# Patient Record
Sex: Male | Born: 1995 | Race: White | Hispanic: No | Marital: Single | Smoking: Never smoker
Health system: Southern US, Community
[De-identification: ages and names within clinical notes are randomized; demographics above are authoritative.]

---
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
## 2017-01-09 ENCOUNTER — Ambulatory Visit
Admission: EM | Admit: 2017-01-09 | Discharge: 2017-01-09 | Disposition: A | Discharge status: Home / Self Care | Payer: Self-pay | Attending: Family | Admitting: Family

## 2017-01-09 ENCOUNTER — Encounter: Payer: Self-pay | Admitting: *Deleted

## 2017-01-09 DIAGNOSIS — R51 Headache: Secondary | ICD-10-CM

## 2017-01-09 DIAGNOSIS — J029 Acute pharyngitis, unspecified: Secondary | ICD-10-CM

## 2017-01-09 DIAGNOSIS — R509 Fever, unspecified: Secondary | ICD-10-CM

## 2017-01-09 DIAGNOSIS — R69 Illness, unspecified: Secondary | ICD-10-CM

## 2017-01-09 DIAGNOSIS — J111 Influenza due to unidentified influenza virus with other respiratory manifestations: Secondary | ICD-10-CM

## 2017-01-09 LAB — RAPID STREP SCREEN (MED CTR MEBANE ONLY): STREPTOCOCCUS, GROUP A SCREEN (DIRECT): NEGATIVE

## 2017-01-09 LAB — RAPID INFLUENZA A&B ANTIGENS
Influenza A (ARMC): NEGATIVE
Influenza B (ARMC): NEGATIVE

## 2017-01-09 NOTE — ED Provider Notes (Signed)
CSN: [id number]     Arrival date & time 01/09/17  1204 History   None    Chief Complaint  Patient presents with  . Sore Throat  . Fever  . Headache  . Generalized Body Aches   (Consider location/radiation/quality/duration/timing/severity/associated sxs/prior Treatment) Chief complaint of sore throat initially 2  days, unchanged.  Endorses green mucous cough. fever tmax 101 yesterday, body aches and chills,swollen glands.  Fever, chills, HA, 'joint achiness' has resolved since this morning. Describes headache as 'mild'.  NO wheezing, sob, vision changes,rash  Nonsmoker  When 5-6 yo had asthma.   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        History reviewed. No pertinent past medical history. History reviewed. No pertinent surgical history. History reviewed. No pertinent family history. Social History  Substance Use Topics  . Smoking status: Never Smoker  . Smokeless tobacco: Never Used  . Alcohol use No    Review of Systems  Constitutional: Positive for chills and fever.  HENT: Positive for congestion, sinus pressure, sore throat and trouble swallowing. Negative for ear pain and rhinorrhea.   Respiratory: Positive for cough. Negative for shortness of breath and wheezing.   Cardiovascular: Negative for chest pain and palpitations.  Gastrointestinal: Negative for abdominal pain, diarrhea, nausea and vomiting.  Genitourinary: Negative for dysuria.  Musculoskeletal: Negative for myalgias.  Skin: Negative for rash.  Neurological: Positive for headaches.  Hematological: Negative for adenopathy.    Allergies  Patient has no known allergies.  Home Medications   Prior to Admission medications   Not on File   Meds Ordered and Administered this Visit  Medications - No data to display  BP 110/62 (BP Location: Left Arm)   Pulse 63   Temp 98.4 F (36.9 C) (Oral)   Resp 16   Ht 5\' 7"  (1.702 m)   Wt 135 lb (61.2 kg)   SpO2 100%    BMI 21.14 kg/m  No data found.   Physical Exam  Constitutional: Vital signs are normal. He appears well-developed and well-nourished.  HENT:  Head: Normocephalic and atraumatic.  Right Ear: Hearing, tympanic membrane, external ear and ear canal normal. No drainage, swelling or tenderness. Tympanic membrane is not injected, not erythematous and not bulging. No middle ear effusion. No decreased hearing is noted.  Left Ear: Hearing, tympanic membrane, external ear and ear canal normal. No drainage, swelling or tenderness. Tympanic membrane is not injected, not erythematous and not bulging.  No middle ear effusion. No decreased hearing is noted.  Nose: Nose normal. Right sinus exhibits no maxillary sinus tenderness and no frontal sinus tenderness. Left sinus exhibits no maxillary sinus tenderness and no frontal sinus tenderness.  Mouth/Throat: Uvula is midline, oropharynx is clear and moist and mucous membranes are normal. No oropharyngeal exudate, posterior oropharyngeal edema, posterior oropharyngeal erythema or tonsillar abscesses.  Eyes: Pupils are equal, round, and reactive to light. Conjunctivae, EOM and lids are normal. Lids are everted and swept, no foreign bodies found.  Neck: Normal range of motion and full passive range of motion without pain. Neck supple. No neck rigidity. No Brudzinski's sign and no Kernig's sign noted.  Cardiovascular: Regular rhythm and normal heart sounds.   Pulmonary/Chest: Effort normal and breath sounds normal. No respiratory distress. He has no wheezes. He has no rhonchi. He has no rales.  Lymphadenopathy:       Head (right side): No submental, no submandibular, no tonsillar, no preauricular, no posterior auricular and no occipital  adenopathy present.       Head (left side): No submental, no submandibular, no tonsillar, no preauricular, no posterior auricular and no occipital adenopathy present.    He has no cervical adenopathy.  Neurological: He is alert. He has  normal strength. No cranial nerve deficit or sensory deficit. He displays a negative Romberg sign.  Reflex Scores:      Bicep reflexes are 2+ on the right side and 2+ on the left side.      Patellar reflexes are 2+ on the right side and 2+ on the left side. Grip equal and strong bilateral upper extremities. Gait strong and steady. Able to perform rapid alternating movement without difficulty.  Skin: Skin is warm and dry.  Psychiatric: He has a normal mood and affect. His speech is normal and behavior is normal.  Vitals reviewed.   Urgent Care Course     Procedures (including critical care time)  Labs Review Labs Reviewed  RAPID STRE[facility] SCREEN (NOT [facility])  RAPID INFLUENZA A&B ANTIGENS ([facility] ONLY)  CULTURE, GROUP A STREP [facility])    Imaging Review No results found.          MDM   1. Influenza-like illness   2. Pharyngitis, unspecified etiology     Negative strep and influenza. Pending throat culture.Patient is well-appearing and is afebrile. He is not labored and speech. He does not have any severe ha, vision changes, nuchal rigidity. We jointly agreed likely viral in nature. Strict return precautions given.    Allegra Granarnett, Janyth Riera G, FNP 01/09/17 1359

## 2017-01-09 NOTE — Discharge Instructions (Signed)
As discussed, symptoms support viral pharyngitis. Pending throat culture and will call you it were to result positive strep.  As discussed, plenty fluids and rest.  Please stay very vigilant any new or worsening symptoms, please return to urgent care or go to the nearest emergency room.

## 2017-01-09 NOTE — ED Triage Notes (Signed)
Sore throat, headache, body aches, head congestion, x 3-5 days.

## 2017-01-11 LAB — CULTURE, GROUP A STREP (THRC)

## 2017-02-02 ENCOUNTER — Encounter: Payer: Self-pay | Admitting: Emergency Medicine

## 2017-02-02 ENCOUNTER — Emergency Department: Payer: 59

## 2017-02-02 ENCOUNTER — Emergency Department
Admission: EM | Admit: 2017-02-02 | Discharge: 2017-02-02 | Disposition: A | Discharge status: Home / Self Care | Payer: 59 | Attending: Emergency Medicine | Admitting: Emergency Medicine

## 2017-02-02 DIAGNOSIS — K226 Gastro-esophageal laceration-hemorrhage syndrome: Secondary | ICD-10-CM | POA: Insufficient documentation

## 2017-02-02 DIAGNOSIS — K5289 Other specified noninfective gastroenteritis and colitis: Secondary | ICD-10-CM | POA: Insufficient documentation

## 2017-02-02 DIAGNOSIS — R112 Nausea with vomiting, unspecified: Secondary | ICD-10-CM | POA: Insufficient documentation

## 2017-02-02 DIAGNOSIS — R1010 Upper abdominal pain, unspecified: Secondary | ICD-10-CM | POA: Diagnosis present

## 2017-02-02 DIAGNOSIS — K529 Noninfective gastroenteritis and colitis, unspecified: Secondary | ICD-10-CM

## 2017-02-02 LAB — CBC
HEMATOCRIT: 44.5 % (ref 40.0–52.0)
HEMOGLOBIN: 15.4 g/dL (ref 13.0–18.0)
MCH: 28.9 pg (ref 26.0–34.0)
MCHC: 34.7 g/dL (ref 32.0–36.0)
MCV: 83.4 fL (ref 80.0–100.0)
Platelets: 333 10*3/uL (ref 150–440)
RBC: 5.33 MIL/uL (ref 4.40–5.90)
RDW: 13.5 % (ref 11.5–14.5)
WBC: 7.2 10*3/uL (ref 3.8–10.6)

## 2017-02-02 LAB — COMPREHENSIVE METABOLIC PANEL
ALBUMIN: 5 g/dL (ref 3.5–5.0)
ALK PHOS: 61 U/L (ref 38–126)
ALT: 15 U/L — ABNORMAL LOW (ref 17–63)
ANION GAP: 11 (ref 5–15)
AST: 24 U/L (ref 15–41)
BILIRUBIN TOTAL: 0.4 mg/dL (ref 0.3–1.2)
BUN: 12 mg/dL (ref 6–20)
CALCIUM: 9.7 mg/dL (ref 8.9–10.3)
CO2: 28 mmol/L (ref 22–32)
Chloride: 103 mmol/L (ref 101–111)
Creatinine, Ser: 0.92 mg/dL (ref 0.61–1.24)
GFR calc Af Amer: >60 mL/min (ref >60)
GFR calc non Af Amer: >60 mL/min (ref >60)
GLUCOSE: 89 mg/dL (ref 65–99)
POTASSIUM: 3.8 mmol/L (ref 3.5–5.1)
SODIUM: 142 mmol/L (ref 135–145)
Total Protein: 8.3 g/dL — ABNORMAL HIGH (ref 6.5–8.1)

## 2017-02-02 LAB — LIPASE, BLOOD: Lipase: 27 U/L (ref 11–51)

## 2017-02-02 MED ORDER — GI COCKTAIL ~~LOC~~
30.0000 mL | Freq: Once | ORAL | Status: AC
  Administered 2017-02-02: 30 mL via ORAL
  Filled 2017-02-02: qty 30

## 2017-02-02 NOTE — Discharge Instructions (Signed)
Please make sure you remain well-hydrated today and use Pepto-Bismol or Maalox as needed for abdominal discomfort. Follow-up with your primary care physician as needed and return to the emergency department for any concerns such as if you cannot eat or drink, worsening pain, fevers, or for any other concerns whatsoever.  It was a pleasure to take care of you today, and thank you for coming to our emergency department.  If you have any questions or concerns before leaving please ask the nurse to grab me and I'm more than happy to go through your aftercare instructions again.  If you were prescribed any opioid pain medication today such as Norco, Vicodin, Percocet, morphine, hydrocodone, or oxycodone please make sure you do not drive when you are taking this medication as it can alter your ability to drive safely.  If you have any concerns once you are home that you are not improving or are in fact getting worse before you can make it to your follow-up appointment, please do not hesitate to call 911 and come back for further evaluation.  Merrily Brittle, MD  Results for orders placed or performed during the hospital encounter of 02/02/17  Lipase, blood  Result Value Ref Range   Lipase 27 11 - 51 U/L  Comprehensive metabolic panel  Result Value Ref Range   Sodium 142 135 - 145 mmol/L   Potassium 3.8 3.5 - 5.1 mmol/L   Chloride 103 101 - 111 mmol/L   CO2 28 22 - 32 mmol/L   Glucose, Bld 89 65 - 99 mg/dL   BUN 12 6 - 20 mg/dL   Creatinine, Ser 4.69 0.61 - 1.24 mg/dL   Calcium 9.7 8.9 - 62.9 mg/dL   Total Protein 8.3 (H) 6.5 - 8.1 g/dL   Albumin 5.0 3.5 - 5.0 g/dL   AST 24 15 - 41 U/L   ALT 15 (L) 17 - 63 U/L   Alkaline Phosphatase 61 38 - 126 U/L   Total Bilirubin 0.4 0.3 - 1.2 mg/dL   GFR calc non Af Amer >60 >60 mL/min   GFR calc Af Amer >60 >60 mL/min   Anion gap 11 5 - 15  CBC  Result Value Ref Range   WBC 7.2 3.8 - 10.6 K/uL   RBC 5.33 4.40 - 5.90 MIL/uL   Hemoglobin 15.4 13.0 -  18.0 g/dL   HCT 52.8 41.3 - 24.4 %   MCV 83.4 80.0 - 100.0 fL   MCH 28.9 26.0 - 34.0 pg   MCHC 34.7 32.0 - 36.0 g/dL   RDW 01.0 27.2 - 53.6 %   Platelets 333 150 - 440 K/uL   Dg Chest 1 View  Result Date: 02/02/2017 CLINICAL DATA:  Epigastric pain. EXAM: CHEST 1 VIEW COMPARISON:  None. FINDINGS: The heart size and mediastinal contours are within normal limits. Both lungs are clear. The visualized skeletal structures are unremarkable. IMPRESSION: Normal chest x-ray. Electronically Signed   By: Obie Dredge M.D.   On: 02/02/2017 10:16

## 2017-02-02 NOTE — ED Provider Notes (Signed)
[facility] Emergency Department Provider Note  ____________________________________________   First MD Initiated Contact with Patient 02/02/17 1017     (approximate)  I have reviewed the triage vital signs and the nursing notes.   HISTORY  Chief Complaint Abdominal Pain    HPI Ronald Nielsen is a 21 y.o. male who self presents to the emergency department with moderate severity cramping upper abdominal discomfort nausea and vomiting. The pain all began roughly 3 hours after eating a chicken sandwich at [employer] for breakfast. The patient is an employee at [employer] and his symptoms began insidiously. He slowly noted cramping upper abdominal discomfort and nausea. He initially went to the bathroom because he thought he might have to defecate however he then vomited 4 times. The first 3 episodes of emesis were normal in the fourth had a small amount of dark red blood mixed in. His pain is now mild in severity and improved largely after emesis. He has no past medical history. He has no past abdominal surgical history. He does not drink alcohol.   History reviewed. No pertinent past medical history.  There are no active problems to display for this patient.   History reviewed. No pertinent surgical history.  Prior to Admission medications   Not on File    Allergies Patient has no known allergies.  No family history on file.  Social History Social History  Substance Use Topics  . Smoking status: Never Smoker  . Smokeless tobacco: Never Used  . Alcohol use No    Review of Systems Constitutional: No fever/chills Eyes: No visual changes. ENT: No sore throat. Cardiovascular: Denies chest pain. Respiratory: Denies shortness of breath. Gastrointestinal: Positive abdominal pain.  Positive nausea, Positive vomiting.  No diarrhea.  No constipation. Genitourinary: Negative for dysuria. Musculoskeletal: Negative for back pain. Skin: Negative for  rash. Neurological: Negative for headaches, focal weakness or numbness.   ____________________________________________   PHYSICAL EXAM:  VITAL SIGNS: ED Triage Vitals  Enc Vitals Group     BP 02/02/17 0928 (!) 129/92     Pulse Rate 02/02/17 0928 (!) 102     Resp 02/02/17 0928 18     Temp 02/02/17 0928 98.3 F (36.8 C)     Temp Source 02/02/17 0928 Oral     SpO2 02/02/17 0928 100 %     Weight 02/02/17 0928 115 lb (52.2 kg)     Height 02/02/17 0928 5\' 7"  (1.702 m)     Head Circumference --      Peak Flow --      Pain Score 02/02/17 0927 5     Pain Loc --      Pain Edu? --      Excl. in GC? --     Constitutional: Alert and oriented 4 well appearing nontoxic no diaphoresis speaks in full clear sentences Eyes: PERRL EOMI. Head: Atraumatic. Nose: No congestion/rhinnorhea. Mouth/Throat: No trismus Neck: No stridor.   Cardiovascular: Tachycardic rate, regular rhythm. Grossly normal heart sounds.  Good peripheral circulation. Respiratory: Normal respiratory effort.  No retractions. Lungs CTAB and moving good air Gastrointestinal: Soft nondistended nontender no rebound or guarding no peritonitis no McBurney's tenderness negative Rovsing's no costovertebral tenderness negative Murphy's Musculoskeletal: No lower extremity edema   Neurologic:  Normal speech and language. No gross focal neurologic deficits are appreciated. Skin:  Skin is warm, dry and intact. No rash noted. Psychiatric: Mood and affect are normal. Speech and behavior are normal.    ____________________________________________   DIFFERENTIAL includes but  not limited to  Mallory-Weiss tear, Boerhaave syndrome, appendicitis, diverticulitis, gastritis ____________________________________________   LABS (all labs ordered are listed, but only abnormal results are displayed)  Labs Reviewed  COMPREHENSIVE METABOLIC PANEL - Abnormal; Notable for the following:       Result Value   Total Protein 8.3 (*)    ALT 15  (*)    All other components within normal limits  LIPASE, BLOOD  CBC    Blood work normal __________________________________________  EKG   ____________________________________________  RADIOLOGY  Chest x-ray with no acute disease ____________________________________________   PROCEDURES  Procedure(s) performed: no  Procedures  Critical Care performed: no  Observation: no ____________________________________________   INITIAL IMPRESSION / ASSESSMENT AND PLAN / ED COURSE  Pertinent labs & imaging results that were available during my care of the patient were reviewed by me and considered in my medical decision making (see chart for details).  By the time I saw the patient his pain had largely abated. He has a benign abdominal exam and I doubt surgical pathology. His history is most consistent with gastroenteritis followed by retching and vomiting in a Mallory-Weiss tear. He is able to eat and drink. I had a lengthy discussion with mom and the parents at bedside and feel the patient is medically stable for outpatient management. Return precautions given.      ____________________________________________   FINAL CLINICAL IMPRESSION(S) / ED DIAGNOSES  Final diagnoses:  None      NEW MEDICATIONS STARTED DURING THIS VISIT:  New Prescriptions   No medications on file     Note:  This document was prepared using Dragon voice recognition software and may include unintentional dictation errors.     Merrily Brittle, MD 02/02/17 1042

## 2017-02-02 NOTE — ED Notes (Signed)
FIRST NURSE NOTE- pt doubled over at desk in pain. Placed in wheelchair. Reports vomiting with about cup full of blood.

## 2017-02-02 NOTE — ED Triage Notes (Signed)
States was at work this am and had onset epigastric pain followed by multiple episodes of vomiting, the last episode noted streaks of what looked like blood throughout. States pain now moderate.

## 2017-08-14 ENCOUNTER — Other Ambulatory Visit: Payer: Self-pay

## 2017-08-14 ENCOUNTER — Encounter: Payer: Self-pay | Admitting: Emergency Medicine

## 2017-08-14 ENCOUNTER — Ambulatory Visit
Admission: EM | Admit: 2017-08-14 | Discharge: 2017-08-14 | Disposition: A | Discharge status: Home / Self Care | Payer: Federal, State, Local not specified - PPO | Attending: Family Medicine | Admitting: Family Medicine

## 2017-08-14 DIAGNOSIS — R1032 Left lower quadrant pain: Secondary | ICD-10-CM | POA: Diagnosis not present

## 2017-08-14 LAB — CBC WITH DIFFERENTIAL/PLATELET
BASOS PCT: 1 %
Basophils Absolute: 0 10*3/uL (ref 0–0.1)
EOS ABS: 0.1 10*3/uL (ref 0–0.7)
Eosinophils Relative: 2 %
HEMATOCRIT: 41.3 % (ref 40.0–52.0)
HEMOGLOBIN: 14.6 g/dL (ref 13.0–18.0)
LYMPHS ABS: 1.6 10*3/uL (ref 1.0–3.6)
Lymphocytes Relative: 28 %
MCH: 29.3 pg (ref 26.0–34.0)
MCHC: 35.2 g/dL (ref 32.0–36.0)
MCV: 83.2 fL (ref 80.0–100.0)
Monocytes Absolute: 0.4 10*3/uL (ref 0.2–1.0)
Monocytes Relative: 7 %
NEUTROS ABS: 3.5 10*3/uL (ref 1.4–6.5)
NEUTROS PCT: 62 %
Platelets: 257 10*3/uL (ref 150–440)
RBC: 4.96 MIL/uL (ref 4.40–5.90)
RDW: 13.6 % (ref 11.5–14.5)
WBC: 5.6 10*3/uL (ref 3.8–10.6)

## 2017-08-14 LAB — BASIC METABOLIC PANEL
Anion gap: 8 (ref 5–15)
BUN: 15 mg/dL (ref 6–20)
CHLORIDE: 102 mmol/L (ref 101–111)
CO2: 27 mmol/L (ref 22–32)
Calcium: 9 mg/dL (ref 8.9–10.3)
Creatinine, Ser: 0.88 mg/dL (ref 0.61–1.24)
GFR calc non Af Amer: >60 mL/min (ref >60)
Glucose, Bld: 93 mg/dL (ref 65–99)
POTASSIUM: 3.6 mmol/L (ref 3.5–5.1)
Sodium: 137 mmol/L (ref 135–145)

## 2017-08-14 LAB — LIPASE, BLOOD: Lipase: 26 U/L (ref 11–51)

## 2017-08-14 NOTE — Discharge Instructions (Signed)
Rest. No heavy lifting.   Follow up with Gastroenterology.   Follow up with your primary care physician this week as needed. Return to Urgent care for new or worsening concerns.

## 2017-08-14 NOTE — ED Triage Notes (Signed)
Patient c/o left lower abdominal pain off and on for a year.  Patient states that his pain has gotten worse over the past couple of days.  Patient denies N/V.  Patient reports some loose stools.  Patient denies fevers.

## 2017-08-14 NOTE — ED Provider Notes (Signed)
[facility] ____________________________________________  Time seen: Approximately 3:40 PM  I have reviewed the triage vital signs and the nursing notes.   HISTORY  Chief Complaint Abdominal Pain (LLQ)   HPI Ronald Nielsen is a 22 y.o. male presenting with mother at bedside for evaluation of left lower abdominal pain that is been present in total for 1.5 years, however reports feels like the pain has been more frequent over the last 1 week.  States he did have an episode this morning as well as intermittently of pain that increases in severity causing him to double over, states it varies in length of time and pain last.  States pain is always in left lower abdomen.  States when he initially had pain onset he was actively lifting weights and felt like he had a tearing pain to left abdomen.  States pain intermittently improves but never fully resolves.  States occasionally when he feels increased pain he will have a palpable small knot in his abdomen, that he feels that he can massage to where it improves.  Denies any visualized bulge or mass.  Denies any associated nausea, vomiting, diarrhea, and acid reflux, bloody stool or fevers.  Denies trauma or direct injury.  Has continue to remain active.  Does report that he often lifts 50 pounds at work but no change in recent activity.  Patient again reports currently mild pain described as a dull achy pain, was worse earlier today.  States pain is worse with lifting, and activity, improves with rest particularly with laying flat.  Occasionally has absent periods of pain.  Has not taken any over-the-counter medications for the same complaint.  Has not been previously evaluated for same complaint. Denies chest pain, shortness of breath, dysuria, extremity pain, extremity swelling or rash.  Denies dysuria, hematuria or flank pain.  Denies any penile or testicular pain, swelling or visual changes.  Denies recent sickness. Denies recent antibiotic  use.   [facility]: PCP   History reviewed. No pertinent past medical history.  There are no active problems to display for this patient.   History reviewed. No pertinent surgical history.   No current facility-administered medications for this encounter.  No current outpatient medications on file.  Allergies Patient has no known allergies.  History reviewed. No pertinent family history.  Social History Social History   Tobacco Use  . Smoking status: Never Smoker  . Smokeless tobacco: Never Used  Substance Use Topics  . Alcohol use: No  . Drug use: No    Review of Systems Constitutional: No fever/chills Eyes: No visual changes. ENT: No sore throat. Cardiovascular: Denies chest pain. Respiratory: Denies shortness of breath. Gastrointestinal: As above. No nausea, no vomiting.  No diarrhea.  No constipation.  Last bowel movement today. Genitourinary: Negative for dysuria. Musculoskeletal: Negative for back pain. Skin: Negative for rash.  ____________________________________________   PHYSICAL EXAM:  VITAL SIGNS: ED Triage Vitals  Enc Vitals Group     BP 08/14/17 1450 126/77     Pulse Rate 08/14/17 1450 69     Resp 08/14/17 1450 16     Temp 08/14/17 1450 98.4 F (36.9 C)     Temp Source 08/14/17 1450 Oral     SpO2 08/14/17 1450 100 %     Weight 08/14/17 1448 130 lb (59 kg)     Height 08/14/17 1448 5\' 7"  (1.702 m)     Head Circumference --      Peak Flow --      Pain  Score 08/14/17 1448 3     Pain Loc --      Pain Edu? --      Excl. in GC? --     Constitutional: Alert and oriented. Well appearing and in no acute distress. ENT      Head: Normocephalic and atraumatic.      Mouth/Throat: Mucous membranes are moist.Oropharynx non-erythematous. Cardiovascular: Normal rate, regular rhythm. Grossly normal heart sounds.  Good peripheral circulation. Respiratory: Normal respiratory effort without tachypnea nor retractions. Breath sounds are clear  and equal bilaterally. No wheezes, rales, rhonchi. Gastrointestinal: Normal Bowel sounds.  Mild generalized left lower abdominal tenderness, no mass, no bulge, no erythema, skin intact.  No inguinal mass or bulge noted.  No CVA tenderness. Musculoskeletal:  No midline cervical, thoracic or lumbar tenderness to palpation.  Neurologic:  Normal speech and language. Speech is normal. No gait instability.  Skin:  Skin is warm, dry and intact. No rash noted. Psychiatric: Mood and affect are normal. Speech and behavior are normal. Patient exhibits appropriate insight and judgment   ___________________________________________   LABS (all labs ordered are listed, but only abnormal results are displayed)  Labs Reviewed  CBC WITH DIFFERENTIAL/PLATELET  BASIC METABOLIC PANEL  LIPASE, BLOOD    PROCEDURES Procedures   INITIAL IMPRESSION / ASSESSMENT AND PLAN / ED COURSE  Pertinent labs & imaging results that were available during my care of the patient were reviewed by me and considered in my medical decision making (see chart for details).  Well-appearing patient.  No acute distress.  Patient does not appear to have acute abdomen.  Left lower quadrant abdominal pain for last 1.5 years.  Subjectively concerning for abdominal hernia, no hernia noted visualized or palpated on exam.  Hemodynamically stable and labs unremarkable.  Recommend for patient to have outpatient follow-up with GI for further evaluation and likely imaging.  Called and spoke with Dr. Tobi BastosAnna GI on call who states that he will see patient early next week and for patient to call first thing Monday morning as office is currently closed.  Patient and mother agreed with this plan and will follow-up outpatient.  Discussed strict follow-up and sooner return parameters.   Discussed follow up and return parameters including no resolution or any worsening concerns. Patient verbalized understanding and agreed to plan.    ____________________________________________   FINAL CLINICAL IMPRESSION(S) / ED DIAGNOSES  Final diagnoses:  Abdominal pain, left lower quadrant     ED Discharge Orders    None       Note: This dictation was prepared with Dragon dictation along with smaller phrase technology. Any transcriptional errors that result from this process are unintentional.         Renford DillsMiller, Bianey Tesoro, NP 08/14/17 1723

## 2017-08-18 ENCOUNTER — Ambulatory Visit: Payer: Federal, State, Local not specified - PPO | Admitting: Gastroenterology

## 2017-08-18 ENCOUNTER — Encounter: Payer: Self-pay | Admitting: Gastroenterology

## 2017-08-18 VITALS — BP 123/81 | HR 65 | Ht 67.0 in | Wt 139.0 lb

## 2017-08-18 DIAGNOSIS — R1032 Left lower quadrant pain: Secondary | ICD-10-CM | POA: Diagnosis not present

## 2017-08-18 NOTE — Progress Notes (Signed)
Wyline Mood MD, MRCP(U.K) [address]  [address]  [place], [place] [address]  Main: [phone]  Fax: [phone]   Gastroenterology Consultation  Referring Provider:     Raynelle Bring Primary Care Physician:  Raynelle Bring Primary Gastroenterologist:  Dr. Wyline Mood  Reason for Consultation:     Abdominal pain         HPI:   Codey Burling is a 22 y.o. y/o male referred for consultation & management  by Dr. Otilio Connors, [facility].   He is here today as he was referred from the urgent care on 08/14/17 when he presented with abdominal pain. ER evaluation felt felt that he may have an abdominal hernia. He was discharged with plan to follow up in my office.     Abdominal pain: Onset: Began 18 years back , when he started to lift weight , more of a numbness, no pain , subsequently ovet the week had some pain , subsequenlty got worse, Presently occurs multiple times a day . Each episode can last upto 30 mis  Site :LLQ Radiation: localized  Severity :steady pain all the time at 2/10 , at times it is around 6/10  [place] of pain: something like a "tear and sharp" Aggravating factors: exertion , lifting , stretching .  Relieving factors :massaging helps  Weight loss: no  NSAID use: no  PPI use :no  Gall bladder surgery: no  Frequency of bowel movements: every day -two days, not hard  Change in bowel movements: no  Relief with bowel movements: some relief  Gas/Bloating/Abdominal distension: no    No cancers in the family . He works at [employer] a    No past medical history on file.  No past surgical history on file.  Prior to Admission medications   Not on File    No family history on file.   Social History   Tobacco Use  . Smoking status: Never Smoker  . Smokeless tobacco: Never Used  Substance Use Topics  . Alcohol use: No  . Drug use: No    Allergies as of 08/18/2017  . (No Known Allergies)    Review of Systems:    All systems reviewed  and negative except where noted in HPI.   Physical Exam:  There were no vitals taken for this visit. No LMP for male patient. Psych:  Alert and cooperative. Normal mood and affect. General:   Alert,  Well-developed, well-nourished, pleasant and cooperative in NAD Head:  Normocephalic and atraumatic. Eyes:  Sclera clear, no icterus.   Conjunctiva pink. Ears:  Normal auditory acuity. Nose:  No deformity, discharge, or lesions. Mouth:  No deformity or lesions,oropharynx pink & moist. Neck:  Supple; no masses or thyromegaly. Lungs:  Respirations even and unlabored.  Clear throughout to auscultation.   No wheezes, crackles, or rhonchi. No acute distress. Heart:  Regular rate and rhythm; no murmurs, clicks, rubs, or gallops. Abdomen:  Normal bowel sounds.  No bruits.  Soft, non-tender and non-distended without masses, hepatosplenomegaly or hernias noted.  No guarding or rebound tenderness.    Msk:  Symmetrical without gross deformities. Good, equal movement & strength bilaterally. Neurologic:  Alert and oriented x3;  grossly normal neurologically. Skin:  Intact without significant lesions or rashes. No jaundice. Lymph Nodes:  No significant cervical adenopathy. Psych:  Alert and cooperative. Normal mood and affect.  Imaging Studies: No results found.  Assessment and Plan:   Kalen Ratajczak is a 22 y.o. y/o male has been referred for  abdominal pain . History and discription could be either musculoskeletal or IBS-C or a combination    Plan  1. Miralax daily to ensure he has a soft bowel movement consistency of mashed potatoes, if no better in a week he will call and we will consider a CT abdomen  2. High fiber diet and patient information will be provided.   Follow up in 4 weeks  Dr Wyline MoodKiran Joffre Lucks MD,MRCP(U.K)

## 2017-08-20 ENCOUNTER — Ambulatory Visit: Payer: Commercial Indemnity | Admitting: Gastroenterology

## 2017-09-22 ENCOUNTER — Ambulatory Visit: Payer: Federal, State, Local not specified - PPO | Admitting: Gastroenterology

## 2017-09-22 ENCOUNTER — Encounter: Payer: Self-pay | Admitting: Gastroenterology

## 2018-09-18 IMAGING — DX DG CHEST 1V
2 series · 2 of 2 positions shown · non-contrast
Comparison: None.

CLINICAL DATA: Epigastric pain.

EXAM:
CHEST 1 VIEW

[chest ap (1 of 2)]
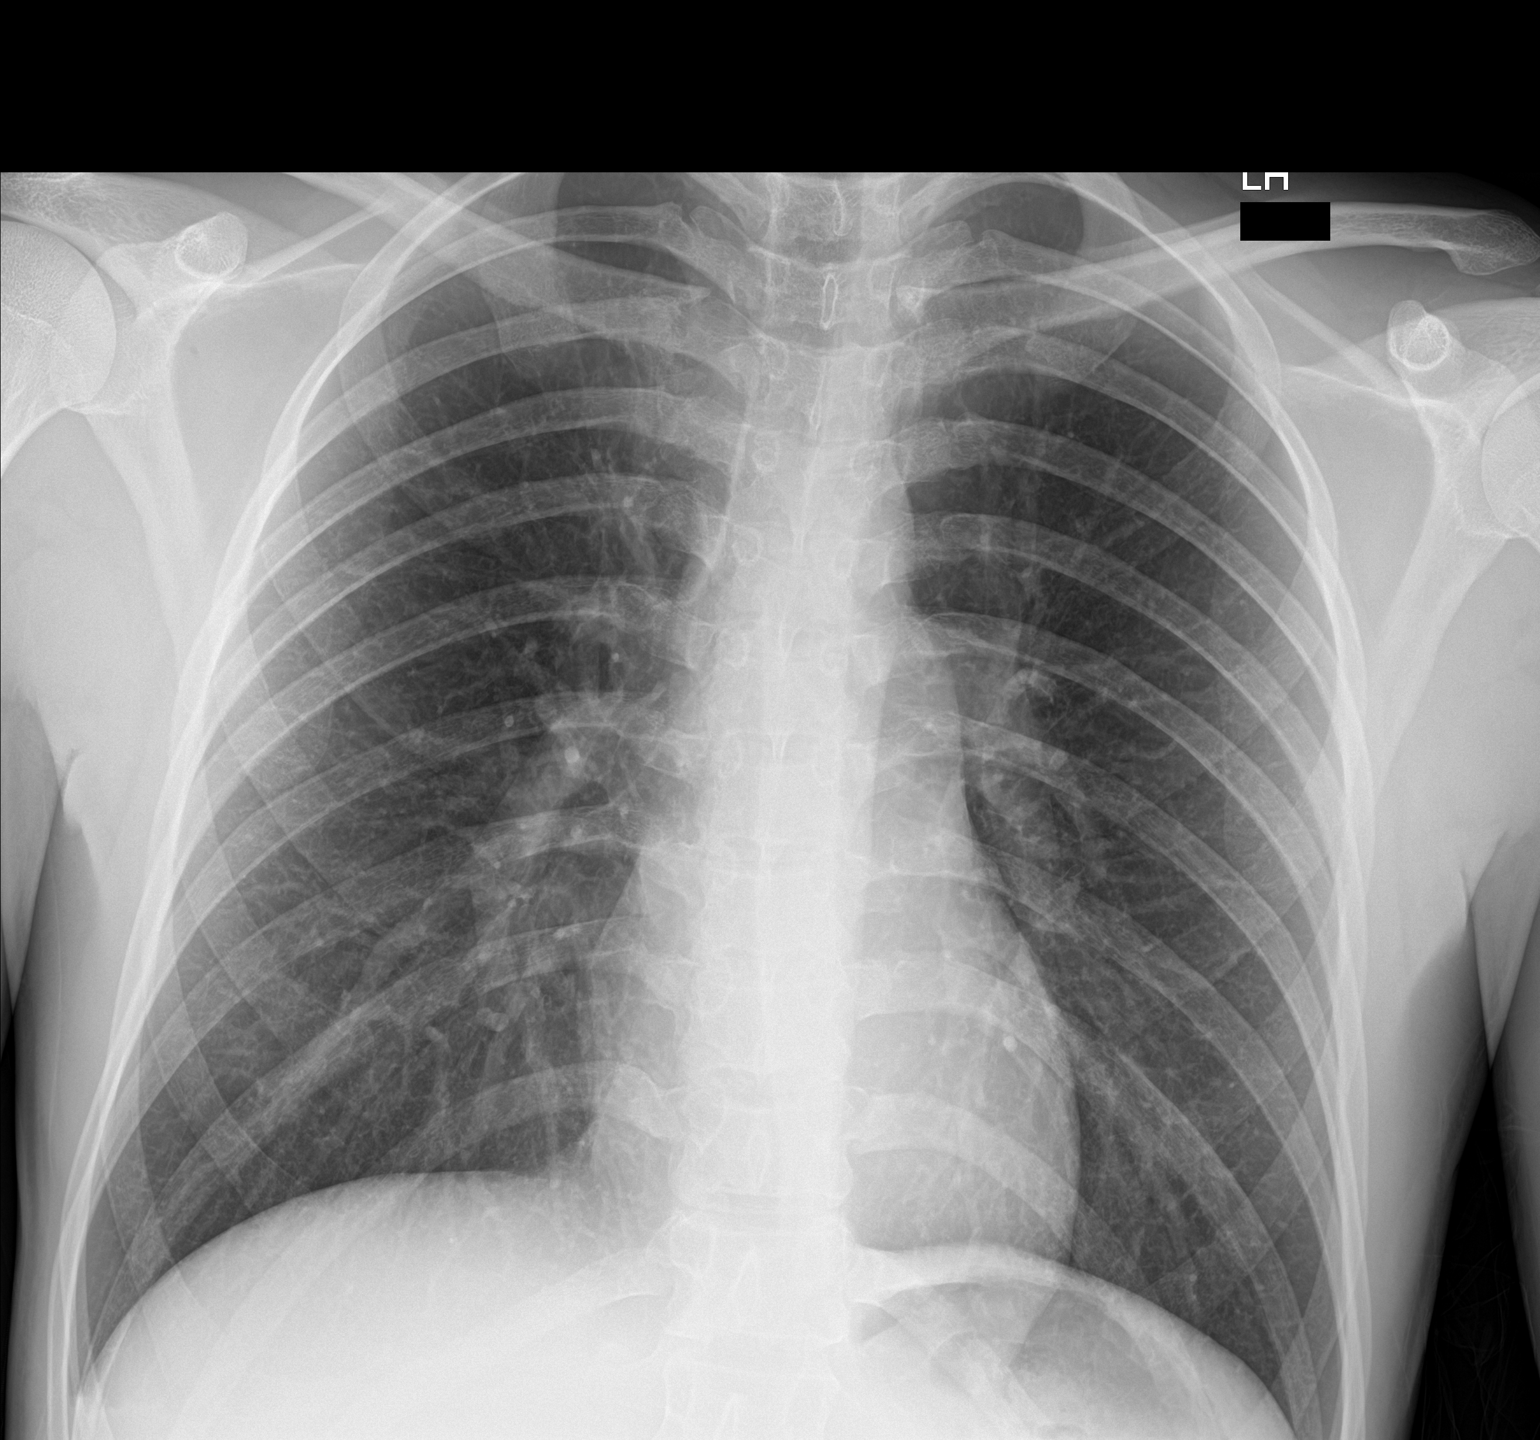

[chest ap (2 of 2)]
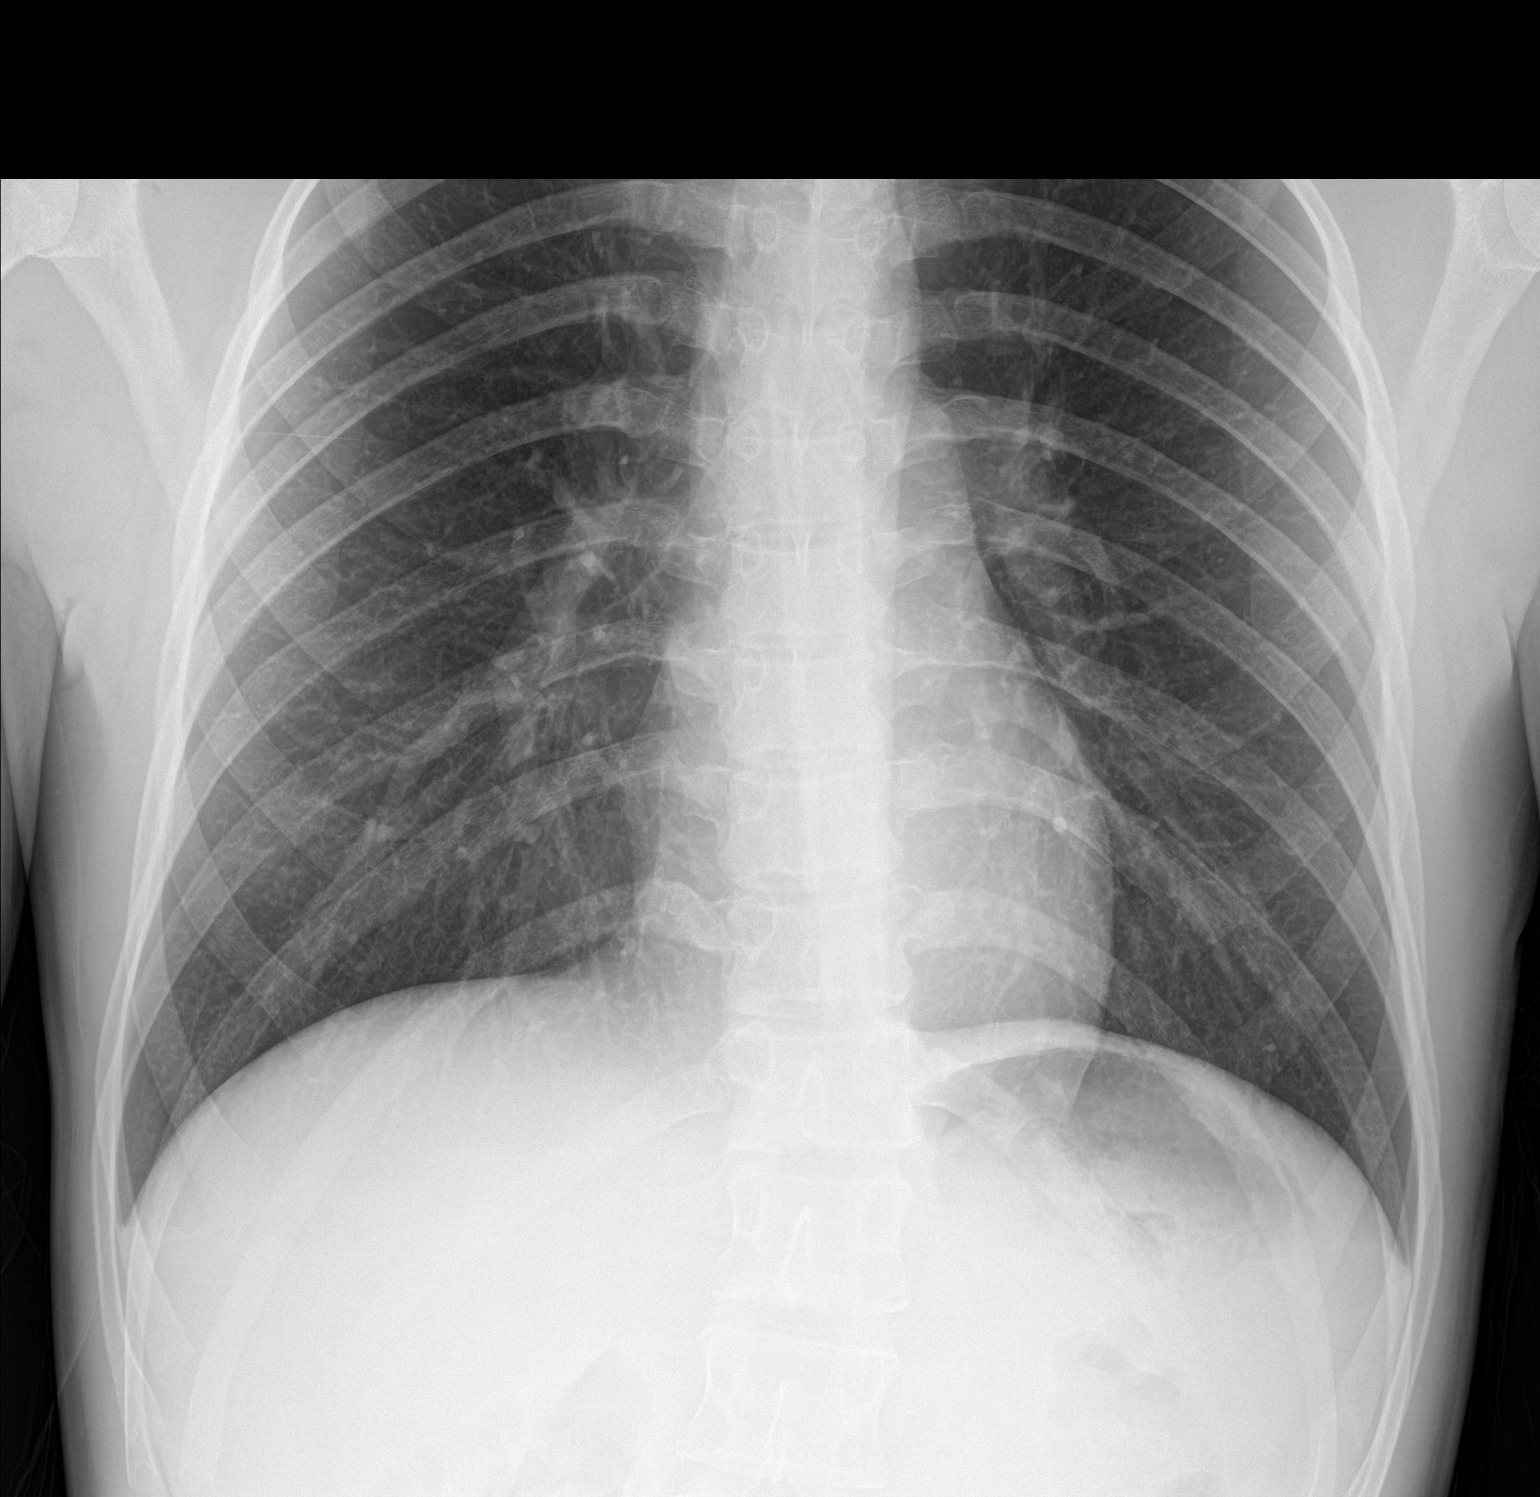

[2 of 2 positions shown; findings below may reference images not displayed]

FINDINGS: The heart size and mediastinal contours are within normal limits.
Both lungs are clear. The visualized skeletal structures are
unremarkable.
IMPRESSION: Normal chest x-ray.
